# Patient Record
Sex: Male | Born: 1955 | Race: White | Hispanic: No | Marital: Married | Smoking: Never smoker
Health system: Southern US, Community
[De-identification: ages and names within clinical notes are randomized; demographics above are authoritative.]

## PROBLEM LIST (undated history)

## (undated) DIAGNOSIS — K219 Gastro-esophageal reflux disease without esophagitis: Secondary | ICD-10-CM

---

## 2005-03-12 ENCOUNTER — Emergency Department: Payer: Self-pay | Admitting: Emergency Medicine

## 2009-05-08 ENCOUNTER — Emergency Department: Payer: Self-pay | Admitting: Emergency Medicine

## 2009-06-26 ENCOUNTER — Emergency Department: Payer: Self-pay | Admitting: Emergency Medicine

## 2009-09-25 ENCOUNTER — Ambulatory Visit: Payer: Self-pay | Admitting: Gastroenterology

## 2009-11-05 ENCOUNTER — Ambulatory Visit: Payer: Self-pay | Admitting: Surgery

## 2009-11-12 ENCOUNTER — Ambulatory Visit: Payer: Self-pay | Admitting: Surgery

## 2010-04-29 ENCOUNTER — Emergency Department: Payer: Self-pay | Admitting: Internal Medicine

## 2011-04-23 ENCOUNTER — Ambulatory Visit: Payer: Self-pay | Admitting: Gastroenterology

## 2011-04-28 ENCOUNTER — Ambulatory Visit: Payer: Self-pay | Admitting: Gastroenterology

## 2012-04-14 ENCOUNTER — Ambulatory Visit: Payer: Self-pay | Admitting: Emergency Medicine

## 2013-05-27 ENCOUNTER — Emergency Department: Payer: Self-pay | Admitting: Emergency Medicine

## 2014-09-20 ENCOUNTER — Emergency Department (HOSPITAL_COMMUNITY): Payer: BC Managed Care – PPO

## 2014-09-20 ENCOUNTER — Encounter (HOSPITAL_COMMUNITY): Payer: Self-pay | Admitting: Emergency Medicine

## 2014-09-20 ENCOUNTER — Emergency Department (HOSPITAL_COMMUNITY)
Admission: EM | Admit: 2014-09-20 | Discharge: 2014-09-20 | Disposition: A | Discharge status: Home / Self Care | Payer: BC Managed Care – PPO | Attending: Emergency Medicine | Admitting: Emergency Medicine

## 2014-09-20 DIAGNOSIS — M7701 Medial epicondylitis, right elbow: Secondary | ICD-10-CM | POA: Diagnosis not present

## 2014-09-20 DIAGNOSIS — M25529 Pain in unspecified elbow: Secondary | ICD-10-CM

## 2014-09-20 DIAGNOSIS — Z79899 Other long term (current) drug therapy: Secondary | ICD-10-CM | POA: Insufficient documentation

## 2014-09-20 DIAGNOSIS — M25521 Pain in right elbow: Secondary | ICD-10-CM | POA: Diagnosis present

## 2014-09-20 DIAGNOSIS — Z8719 Personal history of other diseases of the digestive system: Secondary | ICD-10-CM | POA: Insufficient documentation

## 2014-09-20 HISTORY — DX: Gastro-esophageal reflux disease without esophagitis: K21.9

## 2014-09-20 MED ORDER — HYDROCODONE-ACETAMINOPHEN 5-325 MG PO TABS: 1.0000 | ORAL_TABLET | Freq: Four times a day (QID) | ORAL | Status: DC | PRN

## 2014-09-20 MED ORDER — IBUPROFEN 600 MG PO TABS: 600.0000 mg | ORAL_TABLET | Freq: Four times a day (QID) | ORAL | Status: DC | PRN

## 2014-09-20 NOTE — ED Notes (Signed)
Pt reports recent fall and having a knot and pain to right elbow. No acute distress noted.

## 2014-09-20 NOTE — Discharge Instructions (Signed)
Medial Epicondylitis (Golfer's Elbow) with Rehab Medial epicondylitis involves inflammation and pain around the inner (medial) portion of the elbow. This pain is caused by inflammation of the tendons in the forearm that flex (bring down) the wrist. Medial epicondylitis is also called golfer's elbow, because it is common among golfers. However, it may occur in any individual who flexes the wrist regularly. If medial epicondylitis is left untreated, it may become a chronic problem. SYMPTOMS   Pain, tenderness, or inflammation over the inner (medial) side of the elbow.  Pain or weakness with gripping activities.  Pain that increases with wrist twisting motions (using a screwdriver, playing golf, bowling). CAUSES  Medial epicondylitis is caused by inflammation of the tendons that flex the wrist. Causes of injury may include:  Chronic, repetitive stress and strain to the tendons that run from the wrist and forearm to the elbow.  Sudden strain on the forearm, including wrist snap when serving balls with racquet sports, or throwing a baseball. RISK INCREASES WITH:  Sports or occupations that require repetitive and/or strenuous forearm and wrist movements (pitching a baseball, golfing, carpentry).  Poor wrist and forearm strength and flexibility.  Failure to warm up properly before activity.  Resuming activity before healing, rehabilitation, and conditioning are complete. PREVENTION   Warm up and stretch properly before activity.  Maintain physical fitness:  Strength, flexibility, and endurance.  Cardiovascular fitness.  Wear and use properly fitted equipment.  Learn and use proper technique and have a coach correct improper technique.  Wear a tennis elbow (counterforce) brace. PROGNOSIS  The course of this condition depends on the degree of the injury. If treated properly, acute cases (symptoms lasting less than 4 weeks) are often resolved in 2 to 6 weeks. Chronic (longer lasting  cases) often resolve in 3 to 6 months, but may require physical therapy. RELATED COMPLICATIONS   Frequently recurring symptoms, resulting in a chronic problem. Properly treating the problem the first time decreases frequency of recurrence.  Chronic inflammation, scarring, and partial tendon tear, requiring surgery.  Delayed healing or resolution of symptoms. TREATMENT  Treatment first involves the use of ice and medicine, to reduce pain and inflammation. Strengthening and stretching exercises may reduce discomfort, if performed regularly. These exercises may be performed at home, if the condition is an acute injury. Chronic cases may require a referral to a physical therapist for evaluation and treatment. Your caregiver may advise a corticosteroid injection to help reduce inflammation. Rarely, surgery is needed. MEDICATION  If pain medicine is needed, nonsteroidal anti-inflammatory medicines (aspirin and ibuprofen), or other minor pain relievers (acetaminophen), are often advised.  Do not take pain medicine for 7 days before surgery.  Prescription pain relievers may be given, if your caregiver thinks they are needed. Use only as directed and only as much as you need.  Corticosteroid injections may be recommended. These injections should be reserved only for the most severe cases, because they can only be given a certain number of times. HEAT AND COLD  Cold treatment (icing) should be applied for 10 to 15 minutes every 2 to 3 hours for inflammation and pain, and immediately after activity that aggravates your symptoms. Use ice packs or an ice massage.  Heat treatment may be used before performing stretching and strengthening activities prescribed by your caregiver, physical therapist, or athletic trainer. Use a heat pack or a warm water soak. SEEK MEDICAL CARE IF: Symptoms get worse or do not improve in 2 weeks, despite treatment. EXERCISES  RANGE OF MOTION (  ROM) AND STRETCHING EXERCISES -  Epicondylitis, Medial (Golfer's Elbow) These exercises may help you when beginning to rehabilitate your injury. Your symptoms may go away with or without further involvement from your physician, physical therapist or athletic trainer. While completing these exercises, remember:   Restoring tissue flexibility helps normal motion to return to the joints. This allows healthier, less painful movement and activity.  An effective stretch should be held for at least 30 seconds.  A stretch should never be painful. You should only feel a gentle lengthening or release in the stretched tissue. RANGE OF MOTION - Wrist Flexion, Active-Assisted  Extend your right / left elbow with your fingers pointing down.*  Gently pull the back of your hand towards you, until you feel a gentle stretch on the top of your forearm.  Hold this position for __________ seconds. Repeat __________ times. Complete this exercise __________ times per day.  *If directed by your physician, physical therapist or athletic trainer, complete this stretch with your elbow bent, rather than extended. RANGE OF MOTION - Wrist Extension, Active-Assisted  Extend your right / left elbow and turn your palm upwards.*  Gently pull your palm and fingertips back, so your wrist extends and your fingers point more toward the ground.  You should feel a gentle stretch on the inside of your forearm.  Hold this position for __________ seconds. Repeat __________ times. Complete this exercise __________ times per day. *If directed by your physician, physical therapist or athletic trainer, complete this stretch with your elbow bent, rather than extended. STRETCH - Wrist Extension   Place your right / left fingertips on a tabletop leaving your elbow slightly bent. Your fingers should point backwards.  Gently press your fingers and palm down onto the table, by straightening your elbow. You should feel a stretch on the inside of your forearm.  Hold  this position for __________ seconds. Repeat __________ times. Complete this stretch __________ times per day.  STRENGTHENING EXERCISES - Epicondylitis, Medial (Golfer's Elbow) These exercises may help you when beginning to rehabilitate your injury. They may resolve your symptoms with or without further involvement from your physician, physical therapist or athletic trainer. While completing these exercises, remember:   Muscles can gain both the endurance and the strength needed for everyday activities through controlled exercises.  Complete these exercises as instructed by your physician, physical therapist or athletic trainer. Increase the resistance and repetitions only as guided.  You may experience muscle soreness or fatigue, but the pain or discomfort you are trying to eliminate should never worsen during these exercises. If this pain does get worse, stop and make sure you are following the directions exactly. If the pain is still present after adjustments, discontinue the exercise until you can discuss the trouble with your caregiver. STRENGTH - Wrist Flexors  Sit with your right / left forearm palm-up, and fully supported on a table or countertop. Your elbow should be resting below the height of your shoulder. Allow your wrist to extend over the edge of the surface.  Loosely holding a __________ weight, or a piece of rubber exercise band or tubing, slowly curl your hand up toward your forearm.  Hold this position for __________ seconds. Slowly lower the wrist back to the starting position in a controlled manner. Repeat __________ times. Complete this exercise __________ times per day.  STRENGTH - Wrist Extensors  Sit with your right / left forearm palm-down and fully supported. Your elbow should be resting below the height of your shoulder.   Allow your wrist to extend over the edge of the surface.  Loosely holding a __________ weight, or a piece of rubber exercise band or tubing, slowly  curl your hand up toward your forearm.  Hold this position for __________ seconds. Slowly lower the wrist back to the starting position in a controlled manner. Repeat __________ times. Complete this exercise __________ times per day.  STRENGTH - Ulnar Deviators  Stand with a ____________________ weight in your right / left hand, or sit while holding a rubber exercise band or tubing, with your healthy arm supported on a table or countertop.  Move your wrist so that your pinkie travels toward your forearm and your thumb moves away from your forearm.  Hold this position for __________ seconds and then slowly lower the wrist back to the starting position. Repeat __________ times. Complete this exercise __________ times per day STRENGTH - Grip   Grasp a tennis ball, a dense sponge, or a large, rolled sock in your hand.  Squeeze as hard as you can, without increasing any pain.  Hold this position for __________ seconds. Release your grip slowly. Repeat __________ times. Complete this exercise __________ times per day.  STRENGTH - Forearm Supinators   Sit with your right / left forearm supported on a table, keeping your elbow below shoulder height. Rest your hand over the edge, palm down.  Gently grip a hammer or a soup ladle.  Without moving your elbow, slowly turn your palm and hand upward to a "thumbs-up" position.  Hold this position for __________ seconds. Slowly return to the starting position. Repeat __________ times. Complete this exercise __________ times per day.  STRENGTH - Forearm Pronators  Sit with your right / left forearm supported on a table, keeping your elbow below shoulder height. Rest your hand over the edge, palm up.  Gently grip a hammer or a soup ladle.  Without moving your elbow, slowly turn your palm and hand upward to a "thumbs-up" position.  Hold this position for __________ seconds. Slowly return to the starting position. Repeat __________ times. Complete  this exercise __________ times per day.  Document Released: 11/08/2005 Document Revised: 01/31/2012 Document Reviewed: 02/20/2009 [employer] Patient Information 2015 [employer]. This information is not intended to replace advice given to you by your health care provider. Make sure you discuss any questions you have with your health care provider.  

## 2014-09-20 NOTE — ED Provider Notes (Signed)
CSN: [id number]     Arrival date & time 09/20/14  1424 History  This chart was scribed for non-physician practitioner, Ivar Drapeob Anel Purohit, PA-C,working with Flint MelterElliott L Wentz, MD, by Karle PlumberJennifer Tensley, ED Scribe. This patient was seen in room TR10C/TR10C and the patient's care was started at 3:17 PM.  Chief Complaint  Patient presents with  . Arm Pain   Patient is a 58 y.o. male presenting with arm pain. The history is provided by the patient. No language interpreter was used.  Arm Pain   HPI Comments:  Victor KehrCoy L Nesmith Jr. is a 58 y.o. male with PMH of GERD who presents to the Emergency Department complaining of moderate right arm pain that began a few weeks ago. He states the pain starts in his forearm and radiates into his bicep. Pt notes a nodule on his elbow. Reports falling and catching himself with his right hand. He has not done anything to treat his pain. Touching the area makes the pain worse. He denies any alleviating factors. Denies numbness, tingling or weakness of the LUE, fever, chills, nausea, vomiting or any wounds of the arm.  Past Medical History  Diagnosis Date  . Acid reflux    History reviewed. No pertinent past surgical history. History reviewed. No pertinent family history. History  Substance Use Topics  . Smoking status: Not on file  . Smokeless tobacco: Not on file  . Alcohol Use: No    Review of Systems  Constitutional: Negative for fever and chills.  Gastrointestinal: Negative for nausea and vomiting.  Musculoskeletal: Positive for arthralgias and joint swelling.  Skin: Negative for wound.  Neurological: Negative for weakness and numbness.    Allergies  Review of patient's allergies indicates no known allergies.  Home Medications   Prior to Admission medications   Medication Sig Start Date End Date Taking? Authorizing Provider  acetaminophen (TYLENOL) 500 MG tablet Take 1,000 mg by mouth every 6 (six) hours as needed (for pain).   Yes Historical Provider, MD   Multiple Vitamin (MULTIVITAMIN WITH MINERALS) TABS tablet Take 1 tablet by mouth daily.   Yes Historical Provider, MD   Triage Vitals: BP 135/86  Pulse 53  Temp(Src) 98.2 F (36.8 C) (Oral)  Resp 18  SpO2 97% Physical Exam  Nursing note and vitals reviewed. Constitutional: He is oriented to person, place, and time. He appears well-developed and well-nourished.  HENT:  Head: Normocephalic and atraumatic.  Eyes: EOM are normal.  Neck: Normal range of motion.  Cardiovascular: Normal rate.   Intact distal pulses and brisk capillary refill.  Pulmonary/Chest: Effort normal.  Musculoskeletal: Normal range of motion.  Right medial epicondyle and surrounding soft tissue tender to palpation. No bony abnormality or deformity. ROM and strength 5/5.  Neurological: He is alert and oriented to person, place, and time.  Sensation intact.  Skin: Skin is warm and dry.  Psychiatric: He has a normal mood and affect. His behavior is normal.    ED Course  Procedures (including critical care time) DIAGNOSTIC STUDIES: Oxygen Saturation is 97% on RA, normal by my interpretation.   COORDINATION OF CARE: 3:21 PM- Will X-Ray right elbow. Pt verbalizes understanding and agrees to plan.  Medications - No data to display  Labs Review Labs Reviewed - No data to display  Imaging Review Dg Elbow Complete Right  09/20/2014   CLINICAL DATA:  58 year old male who fell 2 days ago at work onto concrete with right elbow pain and swelling. Medial symptoms. Initial encounter.  EXAM: RIGHT ELBOW -  COMPLETE 3+ VIEW  COMPARISON:  None.  FINDINGS: Bone mineralization is within normal limits. Chronic ossific fragments at the medial epicondyles could be posttraumatic or degenerative. No joint effusion identified. Radial head intact. No acute fracture or dislocation identified.  IMPRESSION: No acute fracture or dislocation identified about the right elbow.   Electronically Signed   By: Augusto GambleLee  Hall M.D.   On: 09/20/2014  15:53     EKG Interpretation None      MDM   Final diagnoses:  Elbow pain  Medial epicondylitis of elbow, right    Patient with medial epicondylitis of the right elbow. Secondary to repetitive motions at work. Discharged with conservative therapy, rice therapy, and orthopedic follow-up. Plain films are negative. Patient understands and agrees with the plan. He is stable for discharge.  I personally performed the services described in this documentation, which was scribed in my presence. The recorded information has been reviewed and is accurate.    Roxy Horsemanobert Lydiann Bonifas, PA-C 09/20/14 1626

## 2014-12-11 ENCOUNTER — Encounter (HOSPITAL_COMMUNITY): Payer: Self-pay | Admitting: Adult Health

## 2014-12-11 ENCOUNTER — Emergency Department (HOSPITAL_COMMUNITY): Payer: BLUE CROSS/BLUE SHIELD

## 2014-12-11 ENCOUNTER — Emergency Department (HOSPITAL_COMMUNITY)
Admission: EM | Admit: 2014-12-11 | Discharge: 2014-12-12 | Disposition: A | Discharge status: Home / Self Care | Payer: BLUE CROSS/BLUE SHIELD | Attending: Emergency Medicine | Admitting: Emergency Medicine

## 2014-12-11 DIAGNOSIS — Y9389 Activity, other specified: Secondary | ICD-10-CM | POA: Insufficient documentation

## 2014-12-11 DIAGNOSIS — Y998 Other external cause status: Secondary | ICD-10-CM | POA: Diagnosis not present

## 2014-12-11 DIAGNOSIS — Y92410 Unspecified street and highway as the place of occurrence of the external cause: Secondary | ICD-10-CM | POA: Insufficient documentation

## 2014-12-11 DIAGNOSIS — S6992XA Unspecified injury of left wrist, hand and finger(s), initial encounter: Secondary | ICD-10-CM | POA: Diagnosis present

## 2014-12-11 DIAGNOSIS — S61412A Laceration without foreign body of left hand, initial encounter: Secondary | ICD-10-CM

## 2014-12-11 DIAGNOSIS — Z8719 Personal history of other diseases of the digestive system: Secondary | ICD-10-CM | POA: Diagnosis not present

## 2014-12-11 DIAGNOSIS — S0001XA Abrasion of scalp, initial encounter: Secondary | ICD-10-CM | POA: Diagnosis not present

## 2014-12-11 DIAGNOSIS — S29001A Unspecified injury of muscle and tendon of front wall of thorax, initial encounter: Secondary | ICD-10-CM | POA: Diagnosis not present

## 2014-12-11 DIAGNOSIS — S199XXA Unspecified injury of neck, initial encounter: Secondary | ICD-10-CM | POA: Diagnosis not present

## 2014-12-11 DIAGNOSIS — T07XXXA Unspecified multiple injuries, initial encounter: Secondary | ICD-10-CM
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
## 2014-12-12 DIAGNOSIS — S61412A Laceration without foreign body of left hand, initial encounter: Secondary | ICD-10-CM | POA: Diagnosis not present

## 2014-12-12 MED ORDER — LIDOCAINE HCL (PF) 1 % IJ SOLN
5.0000 mL | Freq: Once | INTRAMUSCULAR | Status: AC
  Administered 2014-12-12: 5 mL
  Filled 2014-12-12: qty 5

## 2014-12-12 NOTE — Discharge Instructions (Signed)
You may have your sutures removed in 7 days, this can be done by your primary care doctor, urgent care, or the ER.  Expect to be sore for the next few days.   Abrasion An abrasion is a cut or scrape of the skin. Abrasions do not extend through all layers of the skin and most heal within 10 days. It is important to care for your abrasion properly to prevent infection. CAUSES  Most abrasions are caused by falling on, or gliding across, the ground or other surface. When your skin rubs on something, the outer and inner layer of skin rubs off, causing an abrasion. DIAGNOSIS  Your caregiver will be able to diagnose an abrasion during a physical exam.  TREATMENT  Your treatment depends on how large and deep the abrasion is. Generally, your abrasion will be cleaned with water and a mild soap to remove any dirt or debris. An antibiotic ointment may be put over the abrasion to prevent an infection. A bandage (dressing) may be wrapped around the abrasion to keep it from getting dirty.  You may need a tetanus shot if:  You cannot remember when you had your last tetanus shot.  You have never had a tetanus shot.  The injury broke your skin. If you get a tetanus shot, your arm may swell, get red, and feel warm to the touch. This is common and not a problem. If you need a tetanus shot and you choose not to have one, there is a rare chance of getting tetanus. Sickness from tetanus can be serious.  HOME CARE INSTRUCTIONS   If a dressing was applied, change it at least once a day or as directed by your caregiver. If the bandage sticks, soak it off with warm water.   Wash the area with water and a mild soap to remove all the ointment 2 times a day. Rinse off the soap and pat the area dry with a clean towel.   Reapply any ointment as directed by your caregiver. This will help prevent infection and keep the bandage from sticking. Use gauze over the wound and under the dressing to help keep the bandage from  sticking.   Change your dressing right away if it becomes wet or dirty.   Only take over-the-counter or prescription medicines for pain, discomfort, or fever as directed by your caregiver.   Follow up with your caregiver within 24-48 hours for a wound check, or as directed. If you were not given a wound-check appointment, look closely at your abrasion for redness, swelling, or pus. These are signs of infection. SEEK IMMEDIATE MEDICAL CARE IF:   You have increasing pain in the wound.   You have redness, swelling, or tenderness around the wound.   You have pus coming from the wound.   You have a fever or persistent symptoms for more than 2-3 days.  You have a fever and your symptoms suddenly get worse.  You have a bad smell coming from the wound or dressing.  MAKE SURE YOU:   Understand these instructions.  Will watch your condition.  Will get help right away if you are not doing well or get worse. Document Released: 08/18/2005 Document Revised: 10/25/2012 Document Reviewed: 10/12/2011 [facility] Patient Information 2015 [place], [place]. This information is not intended to replace advice given to you by your health care provider. Make sure you discuss any questions you have with your health care provider.  Laceration Care, Adult A laceration is a cut or lesion  that goes through all layers of the skin and into the tissue just beneath the skin. TREATMENT  Some lacerations may not require closure. Some lacerations may not be able to be closed due to an increased risk of infection. It is important to see your caregiver as soon as possible after an injury to minimize the risk of infection and maximize the opportunity for successful closure. If closure is appropriate, pain medicines may be given, if needed. The wound will be cleaned to help prevent infection. Your caregiver will use stitches (sutures), staples, wound glue (adhesive), or skin adhesive strips to repair the laceration.  These tools bring the skin edges together to allow for faster healing and a better cosmetic outcome. However, all wounds will heal with a scar. Once the wound has healed, scarring can be minimized by covering the wound with sunscreen during the day for 1 full year. HOME CARE INSTRUCTIONS  For sutures or staples:  Keep the wound clean and dry.  If you were given a bandage (dressing), you should change it at least once a day. Also, change the dressing if it becomes wet or dirty, or as directed by your caregiver.  Wash the wound with soap and water 2 times a day. Rinse the wound off with water to remove all soap. Pat the wound dry with a clean towel.  After cleaning, apply a thin layer of the antibiotic ointment as recommended by your caregiver. This will help prevent infection and keep the dressing from sticking.  You may shower as usual after the first 24 hours. Do not soak the wound in water until the sutures are removed.  Only take over-the-counter or prescription medicines for pain, discomfort, or fever as directed by your caregiver.  Get your sutures or staples removed as directed by your caregiver. For skin adhesive strips:  Keep the wound clean and dry.  Do not get the skin adhesive strips wet. You may bathe carefully, using caution to keep the wound dry.  If the wound gets wet, pat it dry with a clean towel.  Skin adhesive strips will fall off on their own. You may trim the strips as the wound heals. Do not remove skin adhesive strips that are still stuck to the wound. They will fall off in time. For wound adhesive:  You may briefly wet your wound in the shower or bath. Do not soak or scrub the wound. Do not swim. Avoid periods of heavy perspiration until the skin adhesive has fallen off on its own. After showering or bathing, gently pat the wound dry with a clean towel.  Do not apply liquid medicine, cream medicine, or ointment medicine to your wound while the skin adhesive is in  place. This may loosen the film before your wound is healed.  If a dressing is placed over the wound, be careful not to apply tape directly over the skin adhesive. This may cause the adhesive to be pulled off before the wound is healed.  Avoid prolonged exposure to sunlight or tanning lamps while the skin adhesive is in place. Exposure to ultraviolet light in the first year will darken the scar.  The skin adhesive will usually remain in place for 5 to 10 days, then naturally fall off the skin. Do not pick at the adhesive film. You may need a tetanus shot if:  You cannot remember when you had your last tetanus shot.  You have never had a tetanus shot. If you get a tetanus shot, your arm  may swell, get red, and feel warm to the touch. This is common and not a problem. If you need a tetanus shot and you choose not to have one, there is a rare chance of getting tetanus. Sickness from tetanus can be serious. SEEK MEDICAL CARE IF:   You have redness, swelling, or increasing pain in the wound.  You see a red line that goes away from the wound.  You have yellowish-white fluid (pus) coming from the wound.  You have a fever.  You notice a bad smell coming from the wound or dressing.  Your wound breaks open before or after sutures have been removed.  You notice something coming out of the wound such as wood or glass.  Your wound is on your hand or foot and you cannot move a finger or toe. SEEK IMMEDIATE MEDICAL CARE IF:   Your pain is not controlled with prescribed medicine.  You have severe swelling around the wound causing pain and numbness or a change in color in your arm, hand, leg, or foot.  Your wound splits open and starts bleeding.  You have worsening numbness, weakness, or loss of function of any joint around or beyond the wound.  You develop painful lumps near the wound or on the skin anywhere on your body. MAKE SURE YOU:   Understand these instructions.  Will watch your  condition.  Will get help right away if you are not doing well or get worse. Document Released: 11/08/2005 Document Revised: 01/31/2012 Document Reviewed: 05/04/2011 [facility] Patient Information 2015 [place], [place]. This information is not intended to replace advice given to you by your health care provider. Make sure you discuss any questions you have with your health care provider.  Sutured Wound Care Sutures are stitches that can be used to close wounds. Wound care helps prevent pain and infection.  HOME CARE INSTRUCTIONS   Rest and elevate the injured area until all the pain and swelling are gone.  Only take over-the-counter or prescription medicines for pain, discomfort, or fever as directed by your caregiver.  After 48 hours, gently wash the area with mild soap and water once a day, or as directed. Rinse off the soap. Pat the area dry with a clean towel. Do not rub the wound. This may cause bleeding.  Follow your caregiver's instructions for how often to change the bandage (dressing). Stop using a dressing after 2 days or after the wound stops draining.  If the dressing sticks, moisten it with soapy water and gently remove it.  Apply ointment on the wound as directed.  Avoid stretching a sutured wound.  Drink enough fluids to keep your urine clear or pale yellow.  Follow up with your caregiver for suture removal as directed.  Use sunscreen on your wound for the next 3 to 6 months so the scar will not darken. SEEK IMMEDIATE MEDICAL CARE IF:   Your wound becomes red, swollen, hot, or tender.  You have increasing pain in the wound.  You have a red streak that extends from the wound.  There is pus coming from the wound.  You have a fever.  You have shaking chills.  There is a bad smell coming from the wound.  You have persistent bleeding from the wound. MAKE SURE YOU:   Understand these instructions.  Will watch your condition.  Will get help right away if you  are not doing well or get worse. Document Released: 12/16/2004 Document Revised: 01/31/2012 Document Reviewed: 03/14/2011 [employer] Patient  Information 2015 [place], [place]. This information is not intended to replace advice given to you by your health care provider. Make sure you discuss any questions you have with your health care provider.

## 2014-12-12 NOTE — ED Notes (Signed)
Bacitracin applied to sutures; L hand wrapped with kerlex

## 2014-12-19 ENCOUNTER — Emergency Department: Payer: Self-pay | Admitting: Emergency Medicine

## 2016-09-01 ENCOUNTER — Emergency Department: Payer: Worker's Compensation

## 2016-09-01 ENCOUNTER — Emergency Department
Admission: EM | Admit: 2016-09-01 | Discharge: 2016-09-01 | Disposition: A | Discharge status: Home / Self Care | Payer: Worker's Compensation | Attending: Emergency Medicine | Admitting: Emergency Medicine

## 2016-09-01 ENCOUNTER — Encounter: Payer: Self-pay | Admitting: Emergency Medicine

## 2016-09-01 DIAGNOSIS — M7042 Prepatellar bursitis, left knee: Secondary | ICD-10-CM | POA: Diagnosis not present

## 2016-09-01 DIAGNOSIS — Y9389 Activity, other specified: Secondary | ICD-10-CM | POA: Diagnosis not present

## 2016-09-01 DIAGNOSIS — M25562 Pain in left knee: Secondary | ICD-10-CM | POA: Diagnosis present

## 2016-09-01 MED ORDER — NAPROXEN 500 MG PO TABS: 500.0000 mg | ORAL_TABLET | Freq: Two times a day (BID) | ORAL | 0 refills | Status: DC

## 2016-09-01 NOTE — ED Provider Notes (Signed)
[facility] Emergency Department Provider Note  ____________________________________________  Time seen: Approximately 3:51 PM  I have reviewed the triage vital signs and the nursing notes.   HISTORY  Chief Complaint Knee Pain    HPI Victor KehrCoy L Smisek Jr. is a 60 y.o. male, NAD, presents to the emergency department accompanied by his wife with one-day history of left knee pain and swelling.  Patient states that around 8:00pm last night, he noticed left knee swelling, redness, bruising, and pain while he was on break at work.  Patient states he may have hit his knee on a machine at work but is uncertain. He reports "walking slow" because he has a sensation that his left knee will give out.  He denies any popping or locking sensation in the knee.  Patient states that symptoms have remained the same since last night.  Has not taken any medications over-the-counter for pain nor completed any supportive care.  He denies loss of ROM or strength of the hips, knees, ankles, or toes.  He denies pain of the back, hips, thighs, right knee, lower legs, ankles, or feet.  He denies fevers, chills. Has had no chest pain or shortness of breath. Denies any abdominal pain, nausea or vomiting. Has not noted any open wounds, lacerations, bruising or weeping from the knee. Denies any numbness, tingling.   Past Medical History:  Diagnosis Date  . Acid reflux     There are no active problems to display for this patient.   History reviewed. No pertinent surgical history.  Prior to Admission medications   Medication Sig Start Date End Date Taking? Authorizing Provider  Multiple Vitamin (MULTIVITAMIN WITH MINERALS) TABS tablet Take 1 tablet by mouth daily.    Historical Provider, MD  naproxen (NAPROSYN) 500 MG tablet Take 1 tablet (500 mg total) by mouth 2 (two) times daily with a meal. 09/01/16   Cailan Antonucci L Karmela Bram, PA-C    Allergies Review of patient's allergies indicates no known  allergies.  No family history on file.  Social History Social History  Substance Use Topics  . Smoking status: Not on file  . Smokeless tobacco: Not on file  . Alcohol use No     Review of Systems  Constitutional: No fever/chills Cardiovascular: No chest pain. Respiratory: No shortness of breath.  Gastrointestinal: No abdominal pain.  No nausea, vomiting.   Musculoskeletal: Positive left knee pain with swelling. Negative for pain of back, hips, thighs, right knee, lower legs, ankles, and feet. No loss of ROM or muscle strength.   Skin: Positive redness, swelling, warmth and bruising to the left knee. Negative for rash, skin sores, open wounds, oozing, weeping. Neurological: Negative for numbness, weakness, tingling. 10-point ROS otherwise negative.  ____________________________________________   PHYSICAL EXAM:  VITAL SIGNS: ED Triage Vitals  Enc Vitals Group     BP 09/01/16 1527 (!) 130/94     Pulse Rate 09/01/16 1527 67     Resp 09/01/16 1527 20     Temp 09/01/16 1527 98.5 F (36.9 C)     Temp Source 09/01/16 1527 Oral     SpO2 09/01/16 1527 97 %     Weight 09/01/16 1527 138 lb (62.6 kg)     Height 09/01/16 1527 5\' 9"  (1.753 m)     Head Circumference --      Peak Flow --      Pain Score 09/01/16 1541 6     Pain Loc --      Pain Edu? --  Excl. in GC? --      Constitutional: Alert and oriented. Well appearing and in no acute distress. Eyes: Conjunctivae are normal.  Head: Atraumatic. Cardiovascular: Good peripheral circulation with 2+ pulses noted in the left lower extremity. Respiratory: Normal respiratory effort without tachypnea or retractions.  Musculoskeletal: Tenderness on palpation of the anterior and medial left knee. Left patella is easily manipulated with some pain. No laxity with varus or valgus stress. No laxity with anterior or posterior drawer. Fluctuant swelling is noted about the anterior and medial knee.  Full ROM of the hips, knees, ankles,  toes bilaterally without difficulty.  Pain about the patella with left full knee flexion.  Muscle strength of hip flexion, knee flexion and extension, plantar flexion, dorsiflexion 5/5 bilaterally.  Negative McMurray's test.  No bony abnormalities or crepitus appreciated.  Neurologic:  Normal speech and language. No gross focal neurologic deficits are appreciated.  Skin:  Skin is warm, dry and intact. No rash, skin sores, open wounds noted. Blue ecchymosis noted about the medial left knee. Psychiatric: Mood and affect are normal. Speech and behavior are normal. Patient exhibits appropriate insight and judgement.   ____________________________________________   Freda Munro ____________________________________________  EKG  None ____________________________________________  RADIOLOGY I, Hope Pigeon, personally viewed and evaluated these images (plain radiographs) as part of my medical decision making, as well as reviewing the written report by the radiologist.  Dg Knee Complete 4 Views Left  Result Date: 09/01/2016 CLINICAL DATA:  Left knee pain after fall last night EXAM: LEFT KNEE - COMPLETE 4+ VIEW COMPARISON:  None. FINDINGS: Four views of the left knee submitted. Significant prepatellar soft tissue swelling. Mild narrowing of medial joint compartment. Small joint effusion. No acute fracture or subluxation. IMPRESSION: No acute fracture or subluxation. Mild degenerative changes. Significant prepatellar soft tissue swelling. Small joint effusion. Electronically Signed   By: Natasha Mead M.D.   On: 09/01/2016 16:12    ____________________________________________    PROCEDURES  Procedure(s) performed: None   Procedures   Medications - No data to display   ____________________________________________   INITIAL IMPRESSION / ASSESSMENT AND PLAN / ED COURSE  Pertinent labs & imaging results that were available during my care of the patient were reviewed by me and considered  in my medical decision making (see chart for details).  Clinical Course    Patient's diagnosis is consistent with Prepatellar bursitis of the left knee. Patient was placed in an Ace wrap for comfort care. He states he has crutches that he may use at home. Patient will be discharged home with prescriptions for naproxen to take as directed. Patient was also given a work note to restrict him to no weightbearing about the left lower extremity 3 days. Patient is to follow up with Dr. Rosita Kea in orthopedics if symptoms persist past this treatment course. Patient is given ED precautions to return to the ED for any worsening or new symptoms.    ____________________________________________  FINAL CLINICAL IMPRESSION(S) / ED DIAGNOSES  Final diagnoses:  Prepatellar bursitis of left knee      NEW MEDICATIONS STARTED DURING THIS VISIT:  Discharge Medication List as of 09/01/2016  4:53 PM    START taking these medications   Details  naproxen (NAPROSYN) 500 MG tablet Take 1 tablet (500 mg total) by mouth 2 (two) times daily with a meal., Starting Wed 09/01/2016, Print             Hope Pigeon, PA-C 09/01/16 1717    Kandee Keen  York Cerise, MD 09/01/16 2009

## 2016-09-01 NOTE — ED Triage Notes (Signed)
Presents with left knee pain and swelling   States he may have hit his knee on a machine last pm

## 2017-04-01 ENCOUNTER — Emergency Department
Admission: EM | Admit: 2017-04-01 | Discharge: 2017-04-01 | Disposition: A | Discharge status: Home / Self Care | Payer: Self-pay | Attending: Emergency Medicine | Admitting: Emergency Medicine

## 2017-04-01 ENCOUNTER — Emergency Department: Payer: Self-pay

## 2017-04-01 ENCOUNTER — Encounter: Payer: Self-pay | Admitting: *Deleted

## 2017-04-01 DIAGNOSIS — R197 Diarrhea, unspecified: Secondary | ICD-10-CM | POA: Insufficient documentation

## 2017-04-01 LAB — COMPREHENSIVE METABOLIC PANEL
ALBUMIN: 4.6 g/dL (ref 3.5–5.0)
ALT: 18 U/L (ref 17–63)
AST: 24 U/L (ref 15–41)
Alkaline Phosphatase: 78 U/L (ref 38–126)
Anion gap: 8 (ref 5–15)
BUN: 27 mg/dL — ABNORMAL HIGH (ref 6–20)
CO2: 26 mmol/L (ref 22–32)
CREATININE: 1.4 mg/dL — AB (ref 0.61–1.24)
Calcium: 9.7 mg/dL (ref 8.9–10.3)
Chloride: 102 mmol/L (ref 101–111)
GFR calc Af Amer: >60 mL/min (ref >60)
GFR, EST NON AFRICAN AMERICAN: 52 mL/min — AB (ref >60)
GLUCOSE: 114 mg/dL — AB (ref 65–99)
Potassium: 4.4 mmol/L (ref 3.5–5.1)
Sodium: 136 mmol/L (ref 135–145)
TOTAL PROTEIN: 8.1 g/dL (ref 6.5–8.1)
Total Bilirubin: 1.1 mg/dL (ref 0.3–1.2)

## 2017-04-01 LAB — CBC
HCT: 43.4 % (ref 40.0–52.0)
Hemoglobin: 14.8 g/dL (ref 13.0–18.0)
MCH: 31.1 pg (ref 26.0–34.0)
MCHC: 34.1 g/dL (ref 32.0–36.0)
MCV: 91.4 fL (ref 80.0–100.0)
PLATELETS: 223 10*3/uL (ref 150–440)
RBC: 4.75 MIL/uL (ref 4.40–5.90)
RDW: 14.5 % (ref 11.5–14.5)
WBC: 7.6 10*3/uL (ref 3.8–10.6)

## 2017-04-01 LAB — LIPASE, BLOOD: Lipase: 26 U/L (ref 11–51)

## 2017-04-01 NOTE — ED Notes (Signed)
Pt states was at work today when he had an episode of diarrhea. Pt states his work sent him to the ed to "get something for it so I don't get anyone sick and take a few days off." pt states "I have a problem with that anyway." pt appears in no acute distress. Denies vomiting, nausea. abd soft, active bowel sounds in all quadrants, pt denies abd pain.

## 2017-04-01 NOTE — ED Triage Notes (Addendum)
Pt sent from his work because he has been having "accidents" at work and he needs a note that its okay to work, States every morning for 6 months he has abd pain with diarrhea, states a few episodes of diarrhea this AM and believes he has a hemorrhoid because he has blood in his stool, states he has varicose veins on his legs that he wants evaluated, awake and alert

## 2017-04-01 NOTE — ED Provider Notes (Signed)
[facility] Emergency Department Provider Note   ____________________________________________    I have reviewed the triage vital signs and the nursing notes.   HISTORY  Chief Complaint Abdominal Pain     HPI Victor KehrCoy L Sobiech Jr. is a 61 y.o. male who presents with primary complaint of diarrhea. Patient reports over the last 2 weeks he has had frequent episodes of diarrhea, primarily in the morning. It has happened several times at work which is gotten him trouble. He denies abdominal pain to me. No fevers or chills. No nausea or vomiting. He has not taken anything for this. Denies blood in the stool. No recent travel. No recent antibiotics use   Past Medical History:  Diagnosis Date  . Acid reflux     There are no active problems to display for this patient.   History reviewed. No pertinent surgical history.  Prior to Admission medications   Medication Sig Start Date End Date Taking? Authorizing Provider  Multiple Vitamin (MULTIVITAMIN WITH MINERALS) TABS tablet Take 1 tablet by mouth daily.    [provider]  naproxen (NAPROSYN) 500 MG tablet Take 1 tablet (500 mg total) by mouth 2 (two) times daily with a meal. 09/01/16   Hagler, Jami L, PA-C     Allergies Patient has no known allergies.  History reviewed. No pertinent family history.  Social History Social History  Substance Use Topics  . Smoking status: Not on file  . Smokeless tobacco: Not on file  . Alcohol use No    Review of Systems  Constitutional: No fever/chills Eyes: No visual changes.  ENT: No sore throat. Cardiovascular: Denies chest pain. Respiratory: Denies shortness of breath. Gastrointestinal: As above Genitourinary: Negative for polyuria Musculoskeletal: Negative for back pain. Skin: Negative for rash. Neurological: Negative for headaches or weakness   ____________________________________________   PHYSICAL EXAM:  VITAL SIGNS: ED Triage  Vitals  Enc Vitals Group     BP 04/01/17 1739 121/86     Pulse Rate 04/01/17 1739 73     Resp 04/01/17 1739 16     Temp 04/01/17 1739 98.1 F (36.7 C)     Temp Source 04/01/17 1739 Oral     SpO2 04/01/17 1739 99 %     Weight 04/01/17 1739 150 lb (68 kg)     Height 04/01/17 1739 5\' 11"  (1.803 m)     Head Circumference --      Peak Flow --      Pain Score 04/01/17 1738 0     Pain Loc --      Pain Edu? --      Excl. in GC? --     Constitutional: Alert and oriented. No acute distress. Pleasant and interactive Eyes: Conjunctivae are normal.   Nose: No congestion/rhinnorhea. Mouth/Throat: Mucous membranes are moist.    Cardiovascular: Normal rate, regular rhythm. Grossly normal heart sounds.  Good peripheral circulation. Respiratory: Normal respiratory effort.  No retractions. Gastrointestinal: Soft and nontender. No distention.  Unremarkable rectal exam Genitourinary: deferred Musculoskeletal: No lower extremity tenderness nor edema.  Warm and well perfused Neurologic:  Normal speech and language. No gross focal neurologic deficits are appreciated.  Skin:  Skin is warm, dry and intact. No rash noted. Psychiatric: Mood and affect are normal. Speech and behavior are normal.  ____________________________________________   LABS (all labs ordered are listed, but only abnormal results are displayed)  Labs Reviewed  COMPREHENSIVE METABOLIC PANEL - Abnormal; Notable for the following:  Result Value   Glucose, Bld 114 (*)    BUN 27 (*)    Creatinine, Ser 1.40 (*)    GFR calc non Af Amer 52 (*)    All other components within normal limits  LIPASE, BLOOD  CBC  URINALYSIS, COMPLETE (UACMP) WITH MICROSCOPIC   ____________________________________________  EKG  None ____________________________________________  RADIOLOGY  KUB unremarkable ____________________________________________   PROCEDURES  Procedure(s) performed: No    Critical Care performed:  No ____________________________________________   INITIAL IMPRESSION / ASSESSMENT AND PLAN / ED COURSE  Pertinent labs & imaging results that were available during my care of the patient were reviewed by me and considered in my medical decision making (see chart for details).  Patient's diarrhea appears to be primarily in the morning raising the question of whether it may be functional in nature. We will try conservative measures and attempt loperamide initially. I have asked patient to follow-up with PCP if no improvement. Overall very well-appearing and in no acute distress. Lab works are reassuring. Recommend increase hydration.    ____________________________________________   FINAL CLINICAL IMPRESSION(S) / ED DIAGNOSES  Final diagnoses:  Diarrhea, unspecified type      NEW MEDICATIONS STARTED DURING THIS VISIT:  New Prescriptions   No medications on file     Note:  This document was prepared using Dragon voice recognition software and may include unintentional dictation errors.    [name], MD 04/01/17 [phone]

## 2017-08-30 ENCOUNTER — Encounter: Payer: Self-pay | Admitting: Emergency Medicine

## 2017-08-30 ENCOUNTER — Ambulatory Visit
Admission: EM | Admit: 2017-08-30 | Discharge: 2017-08-30 | Disposition: A | Discharge status: Home / Self Care | Payer: Self-pay | Attending: Family Medicine | Admitting: Family Medicine

## 2017-08-30 ENCOUNTER — Ambulatory Visit (INDEPENDENT_AMBULATORY_CARE_PROVIDER_SITE_OTHER): Payer: Self-pay

## 2017-08-30 DIAGNOSIS — R05 Cough: Secondary | ICD-10-CM

## 2017-08-30 DIAGNOSIS — J029 Acute pharyngitis, unspecified: Secondary | ICD-10-CM

## 2017-08-30 DIAGNOSIS — R1110 Vomiting, unspecified: Secondary | ICD-10-CM

## 2017-08-30 DIAGNOSIS — R059 Cough, unspecified: Secondary | ICD-10-CM

## 2017-08-30 DIAGNOSIS — J189 Pneumonia, unspecified organism: Secondary | ICD-10-CM

## 2017-08-30 DIAGNOSIS — J181 Lobar pneumonia, unspecified organism: Secondary | ICD-10-CM

## 2017-08-30 DIAGNOSIS — R0981 Nasal congestion: Secondary | ICD-10-CM

## 2017-08-30 LAB — CBC WITH DIFFERENTIAL/PLATELET
Basophils Absolute: 0 10*3/uL (ref 0–0.1)
Basophils Relative: 1 %
EOS PCT: 0 %
Eosinophils Absolute: 0 10*3/uL (ref 0–0.7)
HCT: 40 % (ref 40.0–52.0)
HEMOGLOBIN: 13.9 g/dL (ref 13.0–18.0)
LYMPHS ABS: 0.9 10*3/uL — AB (ref 1.0–3.6)
LYMPHS PCT: 10 %
MCH: 31.2 pg (ref 26.0–34.0)
MCHC: 34.8 g/dL (ref 32.0–36.0)
MCV: 89.6 fL (ref 80.0–100.0)
Monocytes Absolute: 1.3 10*3/uL — ABNORMAL HIGH (ref 0.2–1.0)
Monocytes Relative: 14 %
Neutro Abs: 7 10*3/uL — ABNORMAL HIGH (ref 1.4–6.5)
Neutrophils Relative %: 75 %
PLATELETS: 192 10*3/uL (ref 150–440)
RBC: 4.46 MIL/uL (ref 4.40–5.90)
RDW: 14.7 % — ABNORMAL HIGH (ref 11.5–14.5)
WBC: 9.2 10*3/uL (ref 3.8–10.6)

## 2017-08-30 LAB — BASIC METABOLIC PANEL
Anion gap: 9 (ref 5–15)
BUN: 17 mg/dL (ref 6–20)
CHLORIDE: 99 mmol/L — AB (ref 101–111)
CO2: 28 mmol/L (ref 22–32)
Calcium: 8.7 mg/dL — ABNORMAL LOW (ref 8.9–10.3)
Creatinine, Ser: 1.32 mg/dL — ABNORMAL HIGH (ref 0.61–1.24)
GFR calc Af Amer: >60 mL/min (ref >60)
GFR calc non Af Amer: 56 mL/min — ABNORMAL LOW (ref >60)
GLUCOSE: 119 mg/dL — AB (ref 65–99)
POTASSIUM: 4.1 mmol/L (ref 3.5–5.1)
Sodium: 136 mmol/L (ref 135–145)

## 2017-08-30 LAB — RAPID STREP SCREEN (MED CTR MEBANE ONLY): Streptococcus, Group A Screen (Direct): NEGATIVE

## 2017-08-30 LAB — LIPASE, BLOOD: Lipase: 27 U/L (ref 11–51)

## 2017-08-30 MED ORDER — ACETAMINOPHEN 500 MG PO TABS
1000.0000 mg | ORAL_TABLET | Freq: Once | ORAL | Status: AC
  Administered 2017-08-30: 1000 mg via ORAL

## 2017-08-30 MED ORDER — BENZONATATE 100 MG PO CAPS: 100.0000 mg | ORAL_CAPSULE | Freq: Three times a day (TID) | ORAL | 0 refills | Status: AC | PRN

## 2017-08-30 MED ORDER — AMOXICILLIN-POT CLAVULANATE ER 1000-62.5 MG PO TB12: 2.0000 | ORAL_TABLET | Freq: Two times a day (BID) | ORAL | 0 refills | Status: AC

## 2017-08-30 MED ORDER — DOXYCYCLINE HYCLATE 100 MG PO CAPS: 100.0000 mg | ORAL_CAPSULE | Freq: Two times a day (BID) | ORAL | 0 refills | Status: AC

## 2017-08-30 NOTE — ED Triage Notes (Signed)
Patient c/o vomiting, fever, non-prod cough x 1 week.

## 2017-08-30 NOTE — ED Provider Notes (Signed)
[facility] ____________________________________________  Time seen: Approximately 6:43 PM  I have reviewed the triage vital signs and the nursing notes.   HISTORY  Chief Complaint Sore Throat (congestion)   HPI Victor Maxwell. is a 61 y.o. male presenting with wife at bedside for evaluation of 1 week of runny nose, nasal congestion and cough. Reports over the last 3 days cough has worsened and has also had accompanying fever. Reports fever of 103 at home. Has been intermittently taking Tylenol at home, none taken today. States did take some Alka-Seltzer cold medications earlier today. Denies resolution with over-the-counter medications. Reports continues to overall eat and drink well, but states has been having some intermittent vomiting. States intimately has some generalized abdominal pain, stating that it hurts from coughing that no other abdominal pain. States no abdominal pain at this time. Denies any pain chest pain, shortness of breath, wheezing, hemoptysis. States some scratchy sore throat, that is mostly present with coughing. Has overall continued to remain active. However, reports did not go to work the last 2 days and will need a work note. Denies known sick contacts. No sick contacts at home. Denies any recent sickness. States at baseline he often has intermittent constipation with diarrhea, states last bowel movement was a few days ago and described as normal for him. Denies any abnormal colored stools or blood in stool. Denies chest pain, shortness of breath, dysuria, extremity pain, extremity swelling or rash. Denies recent sickness. Denies recent antibiotic use. Denies known cardiac history.   PCP: Judithann Sheen (states several years since last visit).    Past Medical History:  Diagnosis Date  . Acid reflux     There are no active problems to display for this patient.   History reviewed. No pertinent surgical history.   No current facility-administered  medications for this encounter.   Current Outpatient Prescriptions:  .  aspirin EC 81 MG tablet, Take 81 mg by mouth daily., Disp: , Rfl:  .  Multiple Vitamin (MULTIVITAMIN WITH MINERALS) TABS tablet, Take 1 tablet by mouth daily., Disp: , Rfl:  .  amoxicillin-clavulanate (AUGMENTIN XR) 1000-62.5 MG 12 hr tablet, Take 2 tablets by mouth 2 (two) times daily., Disp: 28 tablet, Rfl: 0 .  benzonatate (TESSALON PERLES) 100 MG capsule, Take 1 capsule (100 mg total) by mouth 3 (three) times daily as needed for cough., Disp: 15 capsule, Rfl: 0 .  doxycycline (VIBRAMYCIN) 100 MG capsule, Take 1 capsule (100 mg total) by mouth 2 (two) times daily., Disp: 14 capsule, Rfl: 0  Allergies Patient has no known allergies.  Family History  Problem Relation Age of Onset  . Diabetes Mother   . Hypertension Mother   . Osteoarthritis Mother   . Cancer Father        prostate    Social History Social History  Substance Use Topics  . Smoking status: Never Smoker  . Smokeless tobacco: Never Used  . Alcohol use No    Review of Systems Constitutional: Positive fevers.  Eyes: No visual changes. ENT: No sore throat. Cardiovascular: Denies chest pain. Respiratory: Denies shortness of breath. Gastrointestinal: As above. Genitourinary: Negative for dysuria. Musculoskeletal: Negative for back pain. Skin: Negative for rash. Neurological: Negative for headaches, focal weakness or numbness.   ____________________________________________   PHYSICAL EXAM:  VITAL SIGNS: ED Triage Vitals  Enc Vitals Group     BP 08/30/17 1815 104/79     Pulse Rate 08/30/17 1815 75     Resp 08/30/17 1815 16  Temp 08/30/17 1815 (!) 103.1 F (39.5 C)     Temp Source 08/30/17 1815 Oral     SpO2 08/30/17 1815 100 %     Weight 08/30/17 1818 145 lb 12.8 oz (66.1 kg)     Height 08/30/17 1818  (1.727 m)     Head Circumference --      Peak Flow --      Pain Score 08/30/17 1819 0     Pain Loc --      Pain Edu? --       Excl. in GC? --    Vitals:   08/30/17 1815 08/30/17 1818 08/30/17 1926  BP: 104/79  104/69  Pulse: 75  75  Resp: 16  16  Temp: (!) 103.1 F (39.5 C)  99.1 F (37.3 C)  TempSrc: Oral  Oral  SpO2: 100%  95%  Weight:  145 lb 12.8 oz (66.1 kg)   Height:   (1.727 m)    Constitutional: Alert and oriented. Well appearing and in no acute distress. Eyes: Conjunctivae are normal.  Head: Atraumatic. No sinus tenderness to palpation. No swelling. No erythema.  Ears: no erythema, normal TMs bilaterally.   Nose:Nasal congestion with clear rhinorrhea  Mouth/Throat: Mucous membranes are moist. Mild pharyngeal erythema. No tonsillar swelling or exudate.  Neck: No stridor.  No cervical spine tenderness to palpation. Hematological/Lymphatic/Immunilogical: No cervical lymphadenopathy. Cardiovascular: Normal rate, regular rhythm. Grossly normal heart sounds.  Good peripheral circulation. Respiratory: Normal respiratory effort.  No retractions. Mild diffuse rhonchi, increased left lower. No wheezes. Speaking complete sentences. Dry intermittent cough noted in room. Good air movement.  Gastrointestinal: Soft and nontender. Normal Bowel sounds. No CVA tenderness. Musculoskeletal: Ambulatory with steady gait. No cervical, thoracic or lumbar tenderness to palpation. Neurologic:  Normal speech and language. No gait instability. Skin:  Skin appears warm, dry and intact. No rash noted. Psychiatric: Mood and affect are normal. Speech and behavior are normal.  CURB 65 pneumonia score 0.  ___________________________________________   LABS (all labs ordered are listed, but only abnormal results are displayed)  Labs Reviewed  CBC WITH DIFFERENTIAL/PLATELET - Abnormal; Notable for the following:       Result Value   RDW 14.7 (*)    Neutro Abs 7.0 (*)    Lymphs Abs 0.9 (*)    Monocytes Absolute 1.3 (*)    All other components within normal limits  BASIC METABOLIC PANEL - Abnormal; Notable for the  following:    Chloride 99 (*)    Glucose, Bld 119 (*)    Creatinine, Ser 1.32 (*)    Calcium 8.7 (*)    GFR calc non Af Amer 56 (*)    All other components within normal limits  RAPID STREP SCREEN (NOT AT [facility])  CULTURE, GROUP A STREP ([facility])  LIPASE, BLOOD    RADIOLOGY  Dg Chest 2 View  Result Date: 08/30/2017 CLINICAL DATA:  61 year old male with complaint of vomiting, fever and nonproductive cough for 1 week. EXAM: CHEST  2 VIEW COMPARISON:  Chest x-ray 12/11/2014. FINDINGS: Mild elevation of the right hemidiaphragm, similar to the prior study. Lung volumes are normal. Ill-defined opacity in the left lower lobe, concerning for bronchopneumonia. Right lung is clear. No pleural effusions. Small dense nodule projecting over the left mid lung, new compared to the prior examination. Nodular bilateral apical pleuroparenchymal thickening, similar to prior studies, favored to represent areas of chronic post infectious or inflammatory scarring. However, there is a focal nodular area in the  periphery of the right lung apex measuring approximately 1.5 cm which appears more prominent than the prior study. IMPRESSION: 1. Findings are concerning for left lower lobe bronchopneumonia. There also new nodular densities in the right apex and left mid lung. Although these could simply reflect areas of chronic scarring, or could be related to acute infection, the possibility of neoplasm should be considered, and attention at time of follow-up standing PA and lateral chest x-ray is recommended in 3-4 weeks following trial of antibiotic therapy to ensure resolution and exclude underlying malignancy. Electronically Signed   By: Trudie Reed M.D.   On: 08/30/2017 19:16   Dg Abdomen 1 View  Result Date: 08/30/2017 CLINICAL DATA:  Vomiting, fever, nonproductive cough for 1 week, history acid reflux EXAM: ABDOMEN - 1 VIEW COMPARISON:  04/01/2017 FINDINGS: Numerous pelvic phleboliths. No definite urinary tract  calcification. Bowel gas pattern normal. No bowel dilatation or bowel wall thickening. Bones demineralized with mild biconvex thoracolumbar scoliosis. IMPRESSION: No acute abnormalities. Electronically Signed   By: Ulyses Southward M.D.   On: 08/30/2017 19:12   ____________________________________________   PROCEDURES Procedures    INITIAL IMPRESSION / ASSESSMENT AND PLAN / ED COURSE  Pertinent labs & imaging results that were available during my care of the patient were reviewed by me and considered in my medical decision making (see chart for details).  Very well-appearing patient. Patient very active in room. No over-the-counter medication taken just prior to arrival, denies any recent Tylenol. 1 g oral Tylenol given in urgent care. Vitals monitored. Labs and x-ray results as above. Chest x-ray reviewed and discussed in detail with patient as well as spouse and x-ray copy given. Suspect left lower pneumonia, however discuss other nodular densities present and recommend close follow-up as well as repeat imaging posttreatment to ensure resolution. KUB per radiologist negative. Lab results reviewed. Suspect patient has baseline renal insufficiency as previous creatinine was elevated that was visible and earlier 2018 labs. Discussed these results in detail with patient and wife. Will treat patient outpatient with oral when necessary Tessalon Perles, combination dual antibiotic therapy of 2 g twice a day Augmentin and doxycycline, as patient has not had regular recent PCP follow up in concern of chronic renal insufficiency. Patient and wife states they will follow-up with his most recent PCP and will follow-up in the next week. Also recommend posttreatment follow-up imaging as discussed. Discussed strict follow-up and return parameters. Discussed indication, risks and benefits of medications with patient.  Discussed follow up with Primary care physician this week. Discussed follow up and return parameters  including no resolution or any worsening concerns. Patient verbalized understanding and agreed to plan.   ____________________________________________   FINAL CLINICAL IMPRESSION(S) / ED DIAGNOSES  Final diagnoses:  Pneumonia of left lower lobe due to infectious organism [facility])  Cough     Discharge Medication List as of 08/30/2017  7:54 PM    START taking these medications   Details  amoxicillin-clavulanate (AUGMENTIN XR) 1000-62.5 MG 12 hr tablet Take 2 tablets by mouth 2 (two) times daily., Starting Tue 08/30/2017, Until Tue 09/06/2017, Normal    benzonatate (TESSALON PERLES) 100 MG capsule Take 1 capsule (100 mg total) by mouth 3 (three) times daily as needed for cough., Starting Tue 08/30/2017, Normal    doxycycline (VIBRAMYCIN) 100 MG capsule Take 1 capsule (100 mg total) by mouth 2 (two) times daily., Starting Tue 08/30/2017, Normal        Note: This dictation was prepared with Dragon dictation along with  smaller phrase technology. Any transcriptional errors that result from this process are unintentional.         Renford Dills, NP 08/30/17 2132

## 2017-08-30 NOTE — Discharge Instructions (Signed)
Take medication as prescribed. Rest. Drink plenty of fluids. Close monitoring. Tylenol as discussed.   You need to have close primary care physician follow up as discussed. Follow up this week as well as after completion of medication.   Return to Urgent care or ER for new or worsening concerns.

## 2017-09-02 LAB — CULTURE, GROUP A STREP (THRC)

## 2018-03-14 IMAGING — DX DG KNEE COMPLETE 4+V*L*
4 series · 4 of 4 positions shown · non-contrast
Comparison: None.

CLINICAL DATA: Left knee pain after fall last night

EXAM:
LEFT KNEE - COMPLETE 4+ VIEW

[knee ap]
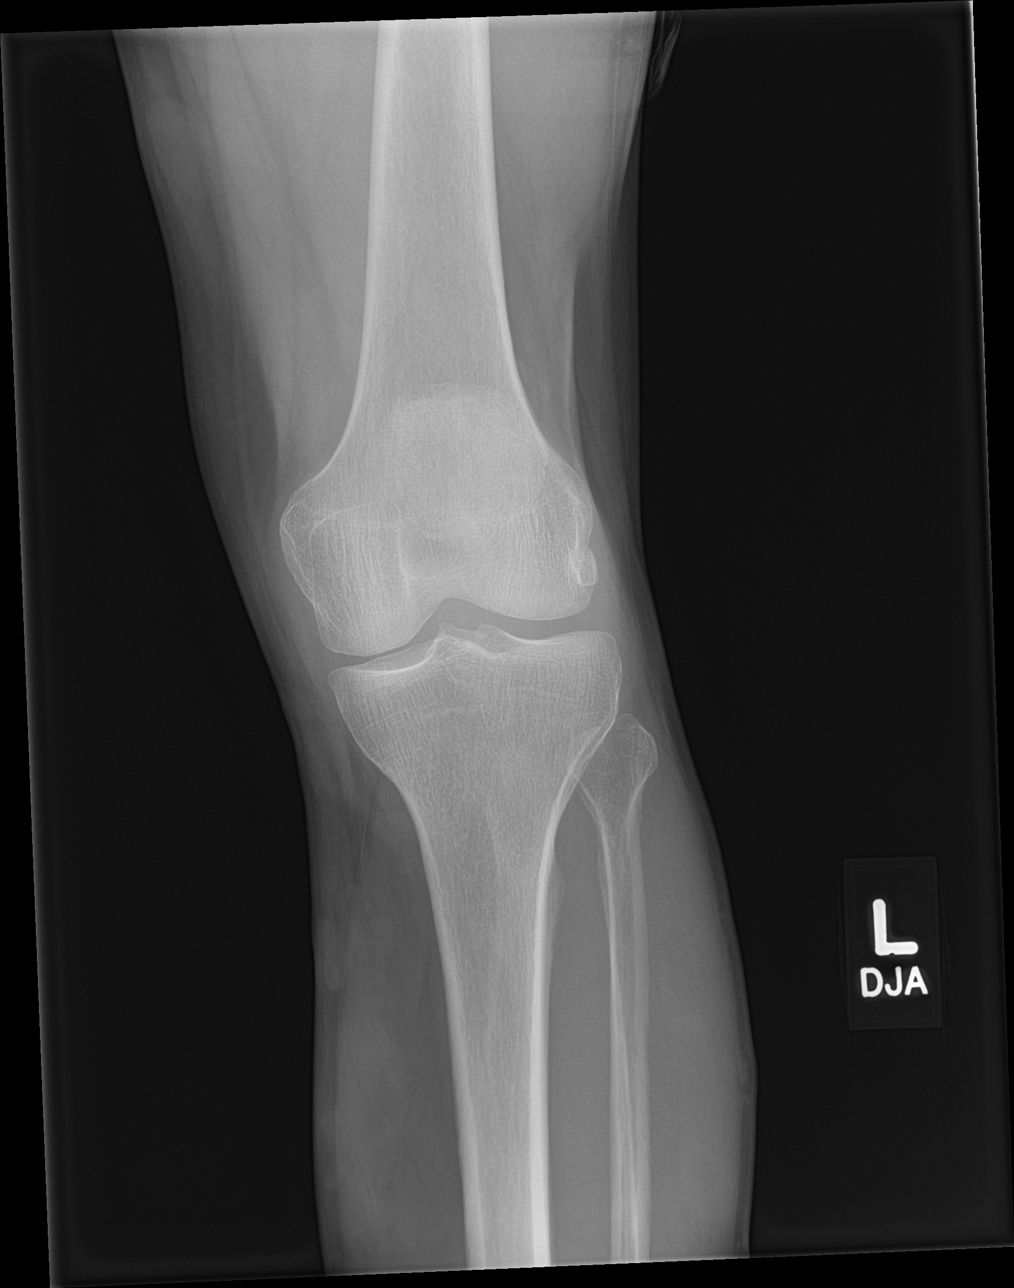

[knee lat]
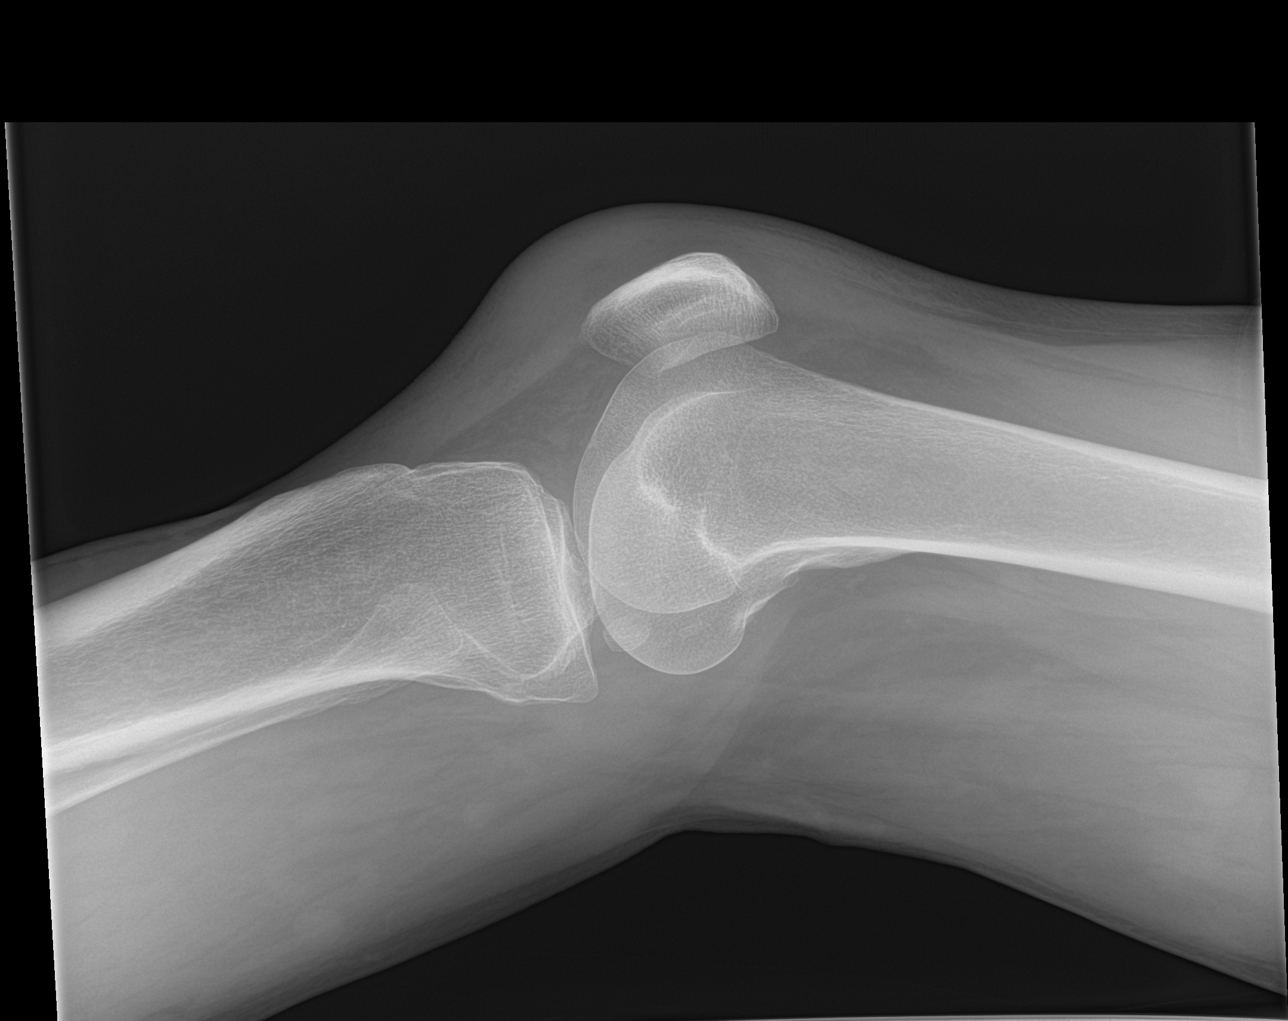

[knee obl (1 of 2)]
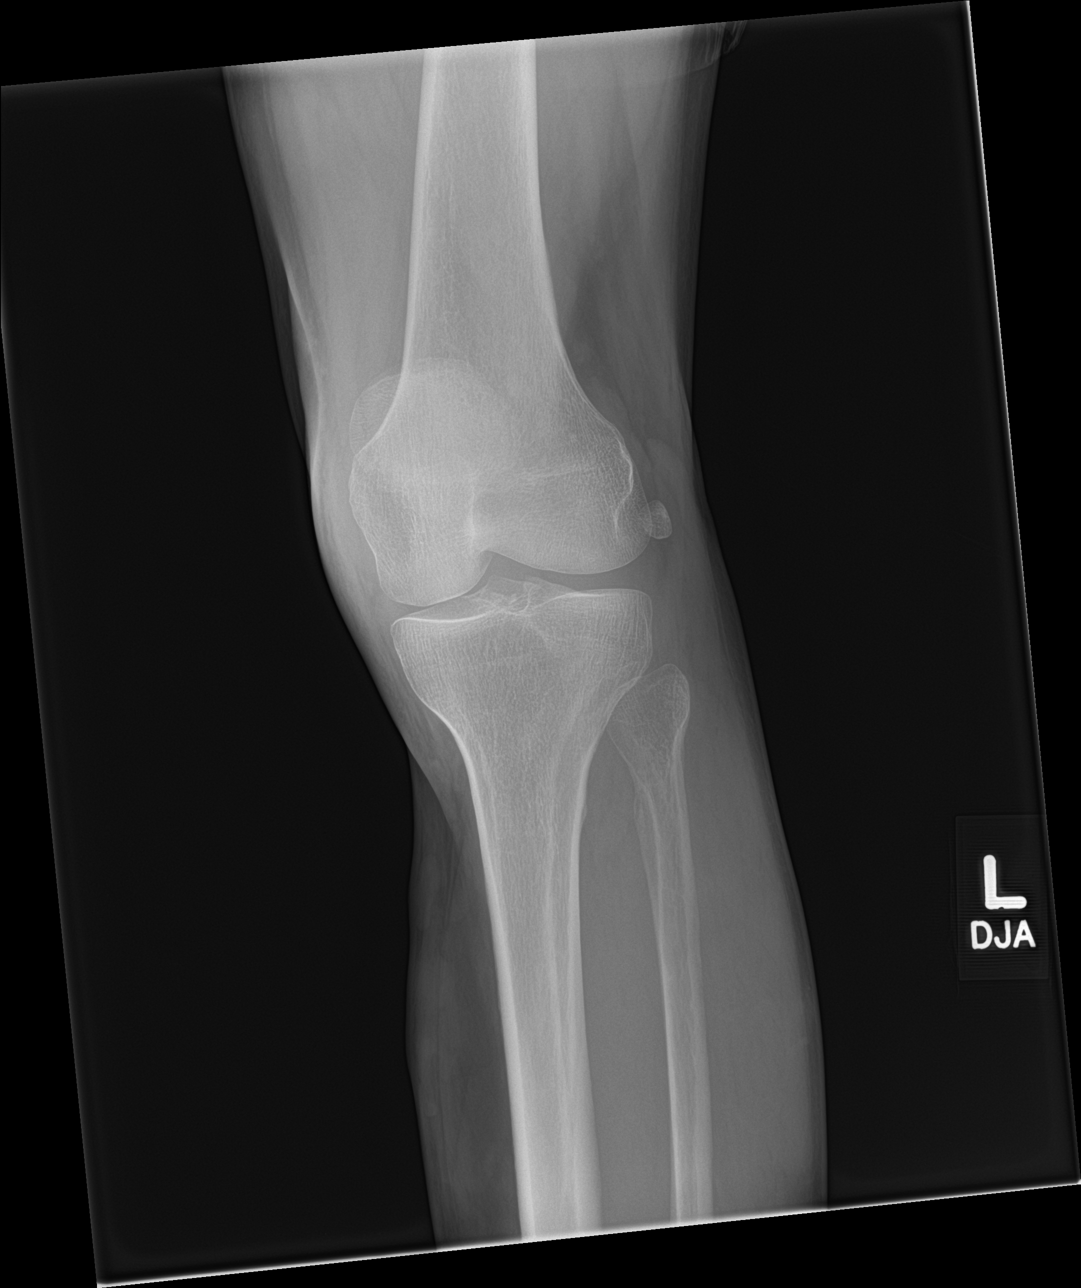

[knee obl (2 of 2)]
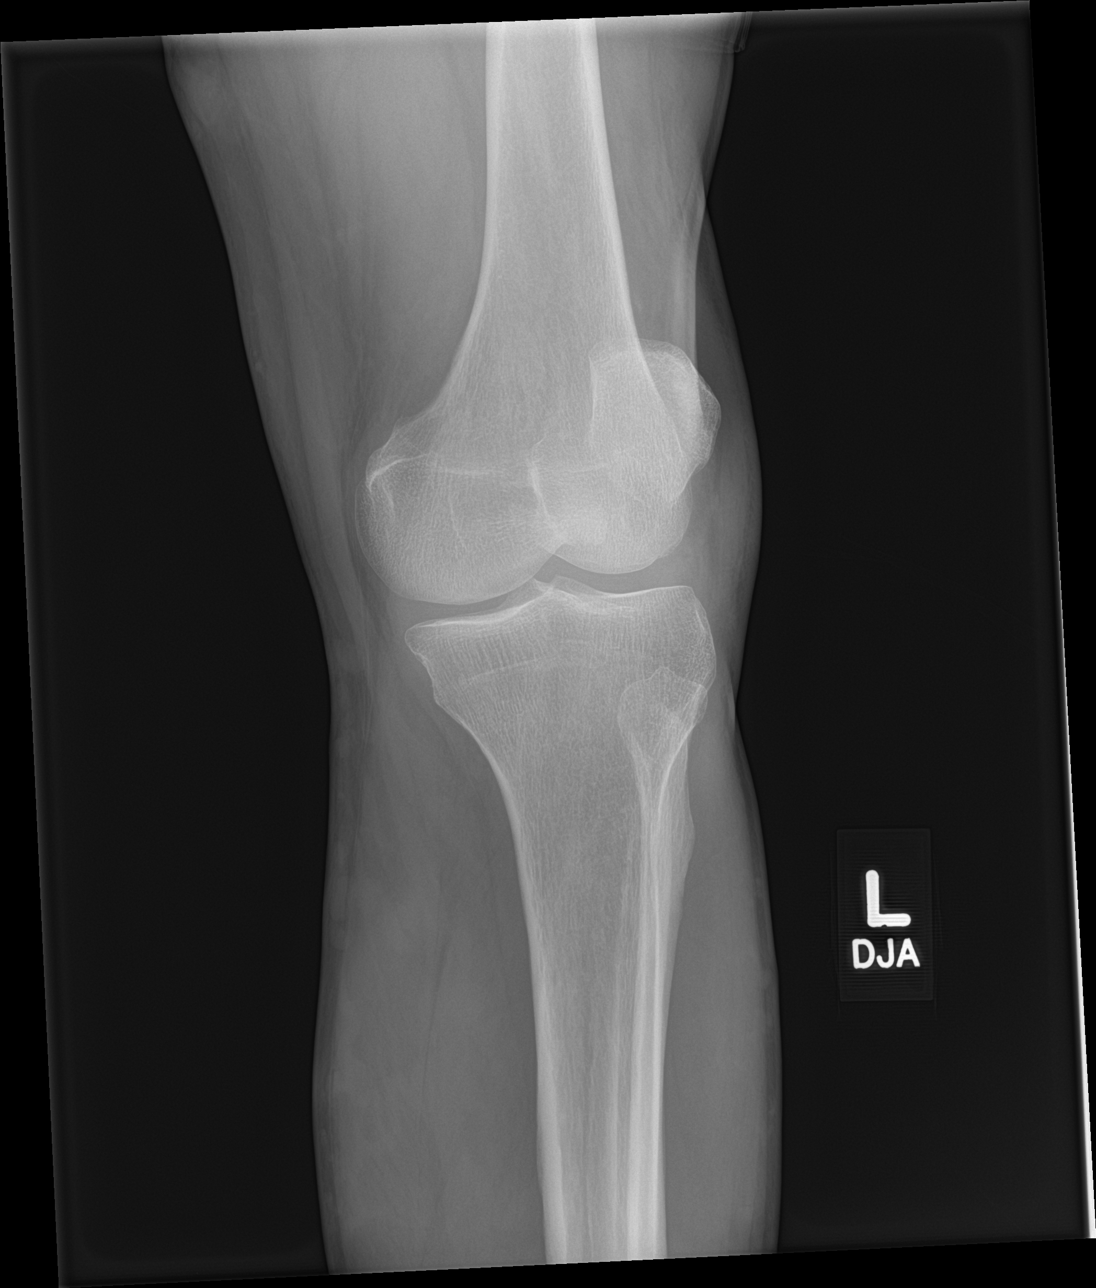

[4 of 4 positions shown; findings below may reference images not displayed]

FINDINGS: Four views of the left knee submitted. Significant prepatellar soft
tissue swelling. Mild narrowing of medial joint compartment. Small
joint effusion. No acute fracture or subluxation.
IMPRESSION: No acute fracture or subluxation. Mild degenerative changes.
Significant prepatellar soft tissue swelling. Small joint effusion.

## 2019-03-12 IMAGING — CR DG ABDOMEN 1V
2 series · 2 of 2 positions shown · non-contrast
Comparison: 04/01/2017

CLINICAL DATA: Vomiting, fever, nonproductive cough for 1 week,
history acid reflux

EXAM:
ABDOMEN - 1 VIEW

[abdomen kub (1 of 2)]
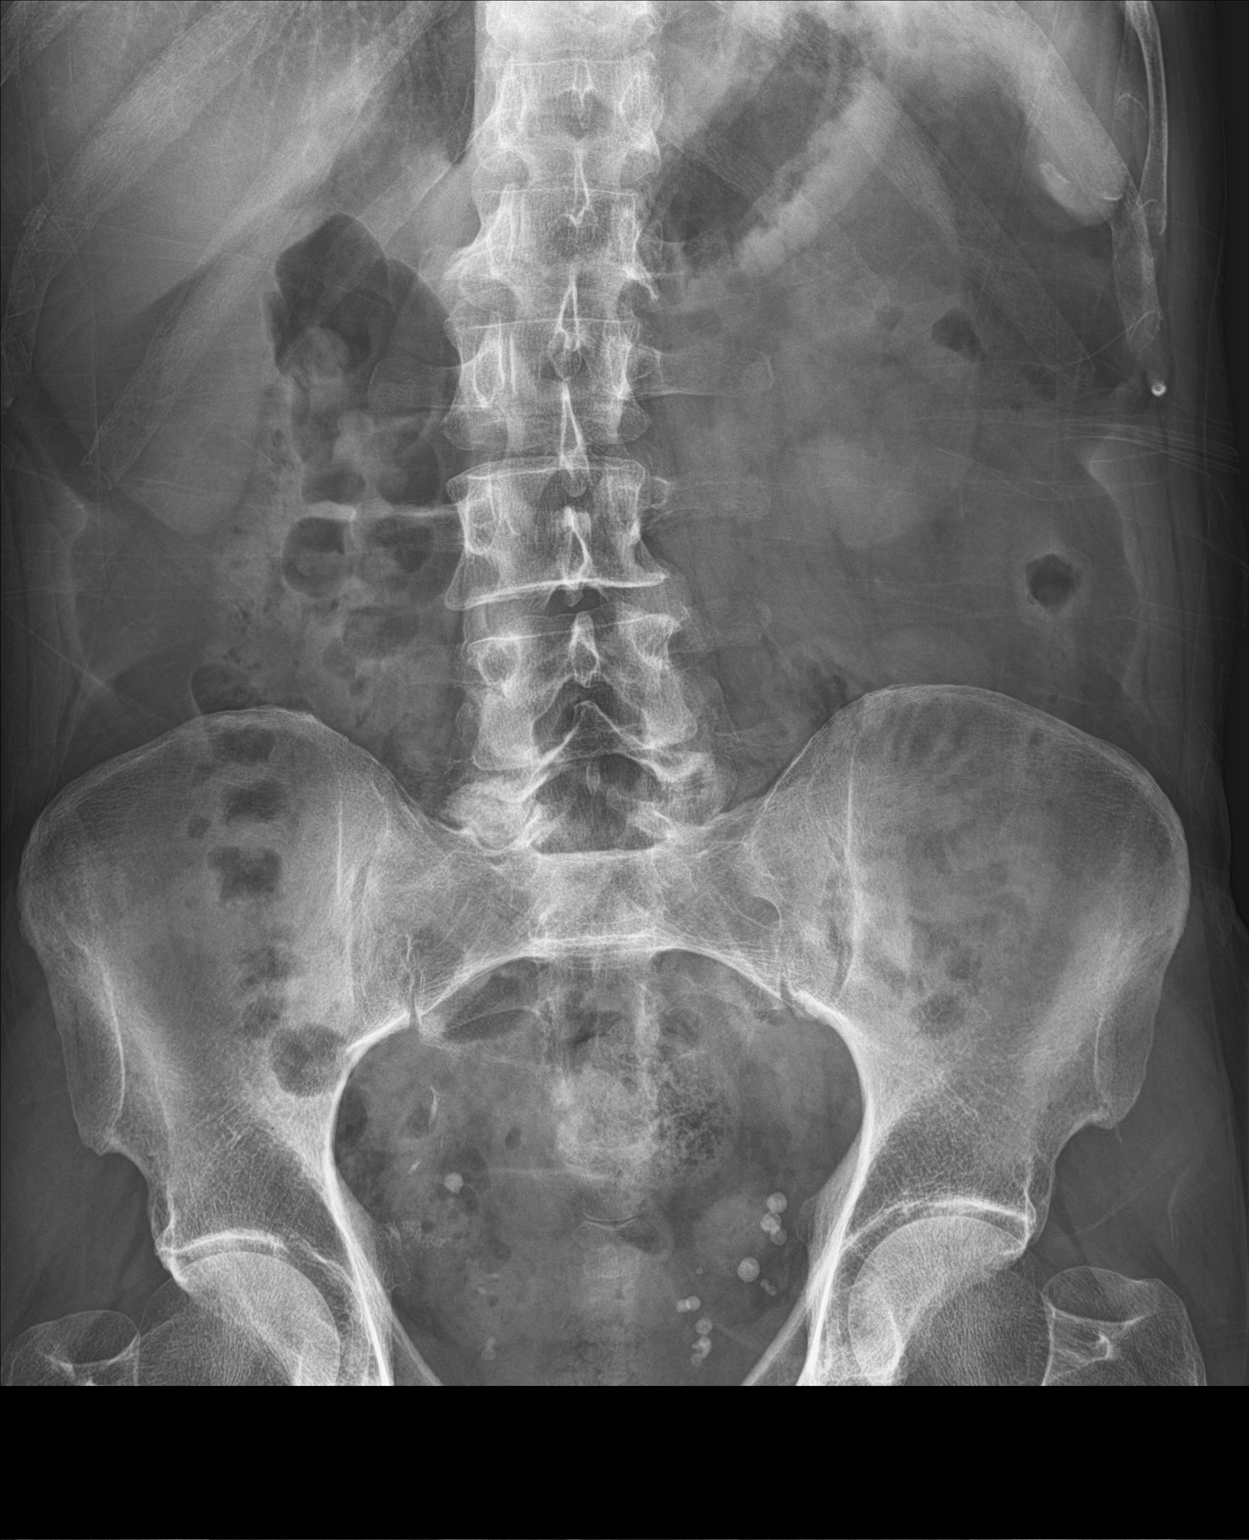

[abdomen kub (2 of 2)]
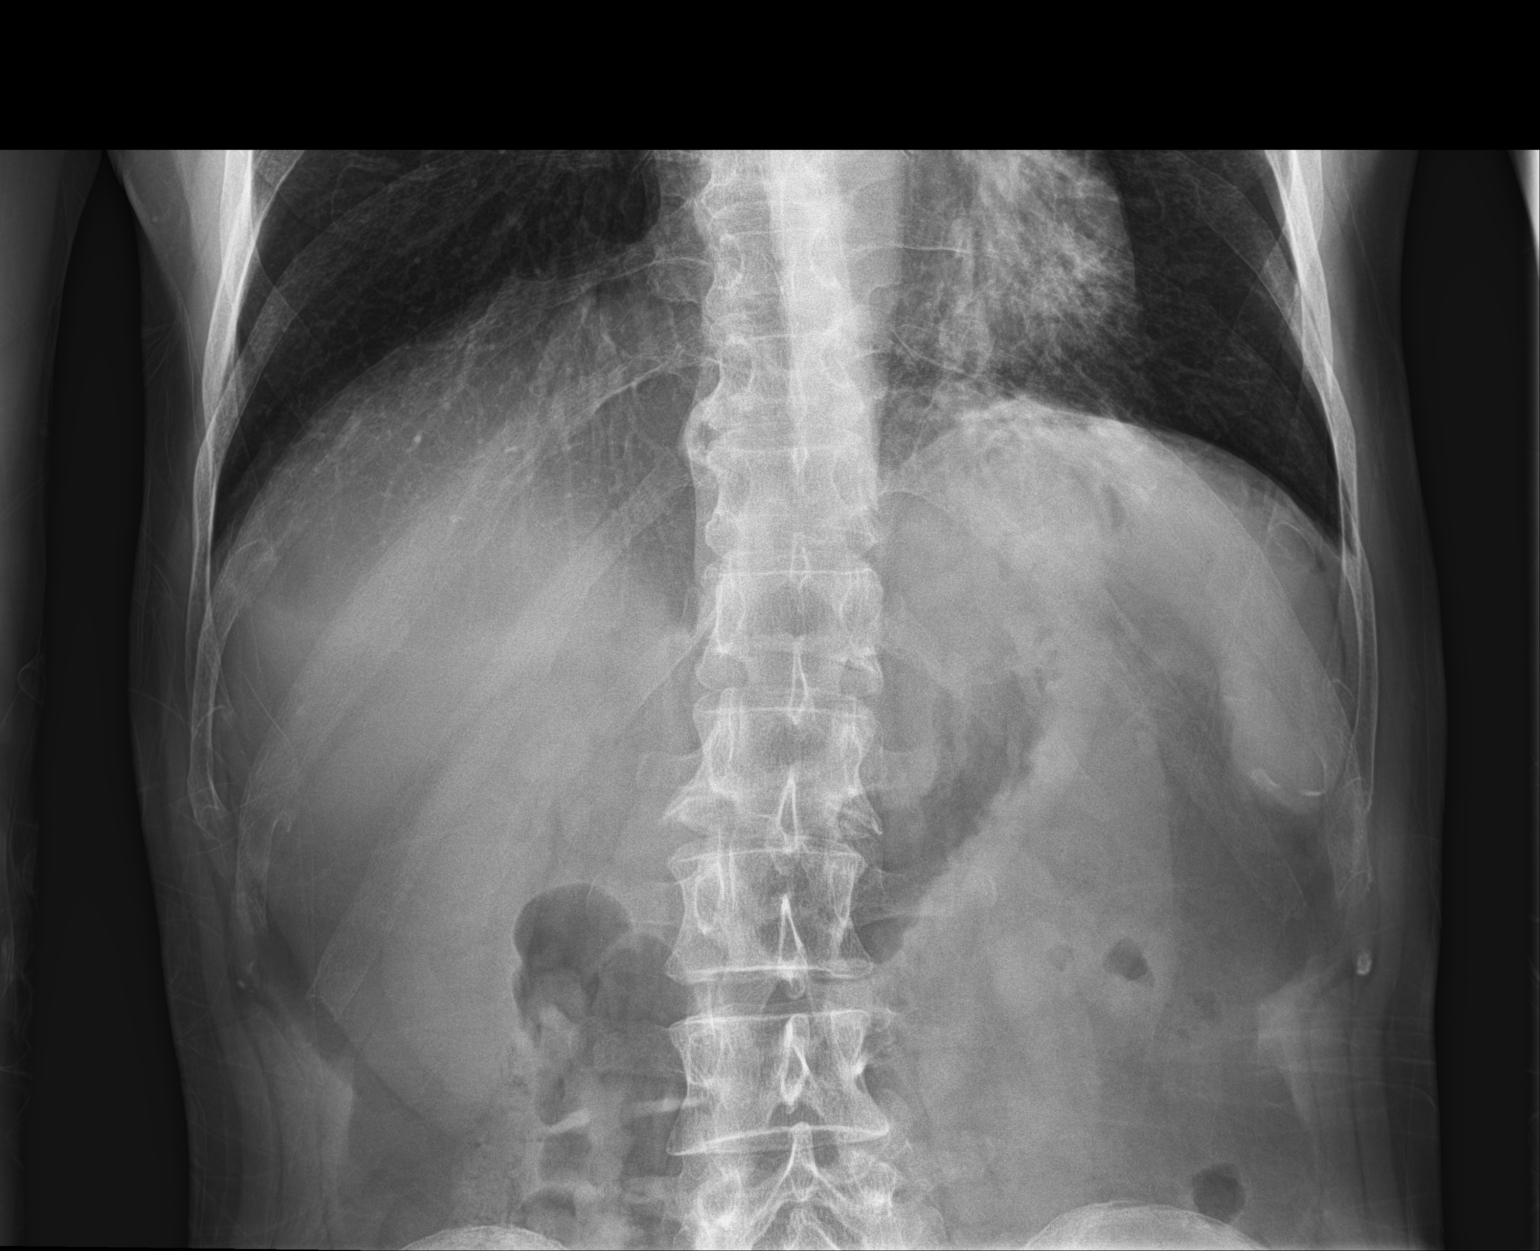

[2 of 2 positions shown; findings below may reference images not displayed]

FINDINGS: Numerous pelvic phleboliths.

No definite urinary tract calcification.

Bowel gas pattern normal.

No bowel dilatation or bowel wall thickening.

Bones demineralized with mild biconvex thoracolumbar scoliosis.
IMPRESSION: No acute abnormalities.
